# Patient Record
Sex: Female | Born: 1955 | Race: White | Hispanic: No | Marital: Married | Smoking: Never smoker
Health system: Southern US, Community
[De-identification: ages and names within clinical notes are randomized; demographics above are authoritative.]

## PROBLEM LIST (undated history)

## (undated) HISTORY — PX: SHOULDER SURGERY: SHX246

## (undated) HISTORY — PX: HX GALL BLADDER SURGERY/CHOLE: SHX55

## (undated) HISTORY — PX: HX FOOT SURGERY: 2100001154

## (undated) HISTORY — PX: HX HYSTERECTOMY: SHX81

## (undated) HISTORY — PX: SHOULDER ARTHROSCOPY W/ ROTATOR CUFF REPAIR: SHX2400

## (undated) HISTORY — PX: FOOT SURGERY: SHX648

## (undated) HISTORY — PX: TONSILLECTOMY: SUR1361

## (undated) HISTORY — PX: CHOLECYSTECTOMY: SHX55

## (undated) HISTORY — PX: ABDOMINAL HYSTERECTOMY: SHX81

---

## 2006-04-22 ENCOUNTER — Emergency Department: Payer: Self-pay | Admitting: Emergency Medicine

## 2006-12-19 IMAGING — CR RIGHT ANKLE - COMPLETE 3+ VIEW
1 series · 5 of 5 positions shown · non-contrast
Comparison: none

REASON FOR EXAM: Fall
COMMENTS:

PROCEDURE:     DXR - DXR ANKLE RIGHT COMPLETE  - April 22, 2006  [DATE]
RESULT:        Five views of the RIGHT ankle show no fracture, dislocation
or other acute bony abnormality.  Incidental note is made of a small plantar
calcaneal spur.

[Series 1: view not recorded · 0.17mm/px · 5 of 5 slices shown]
[im 1/5]
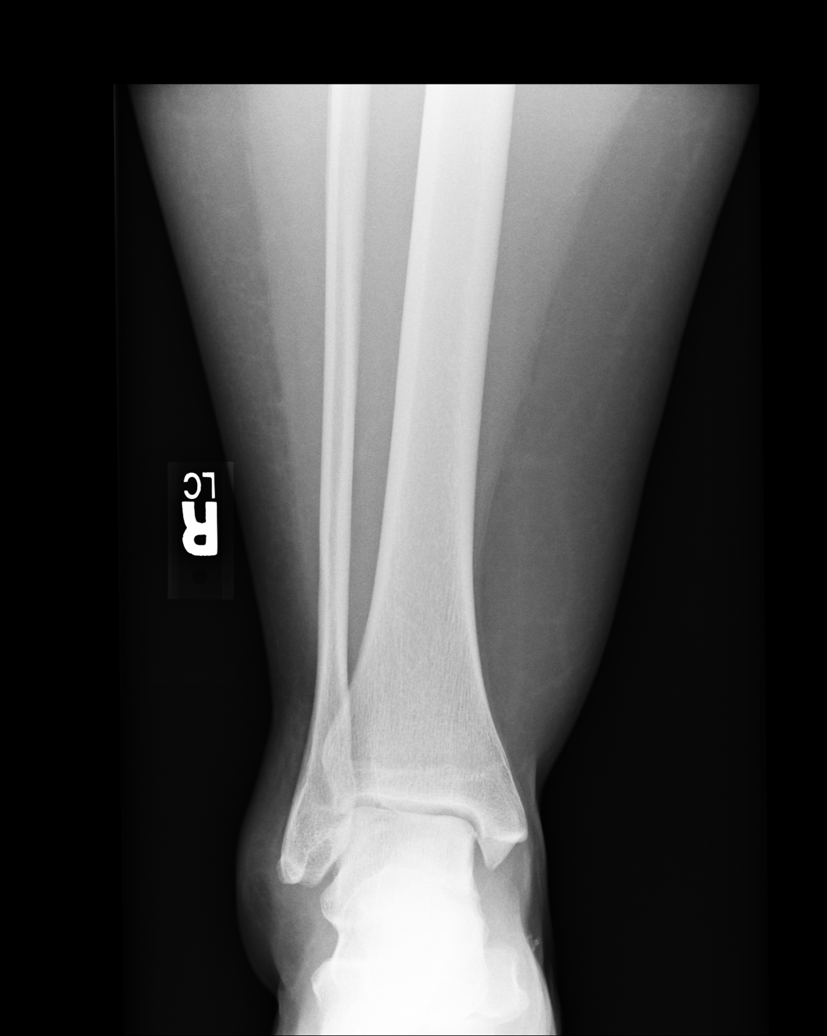
[im 2/5]
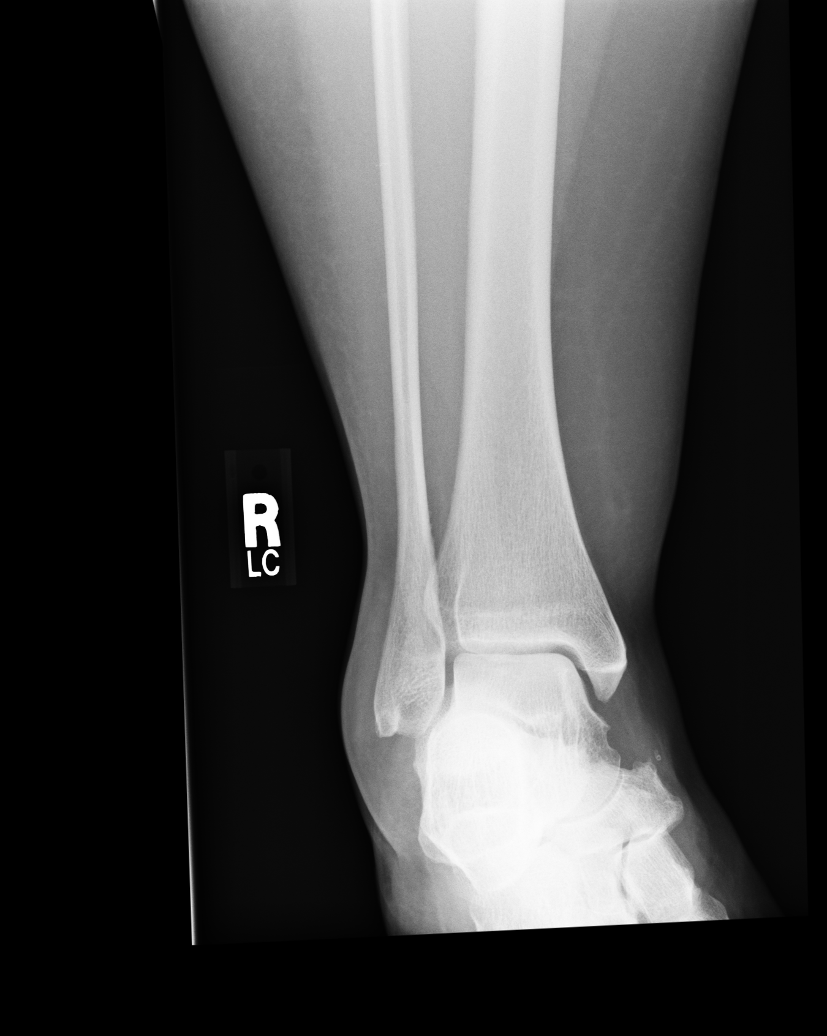
[im 3/5]
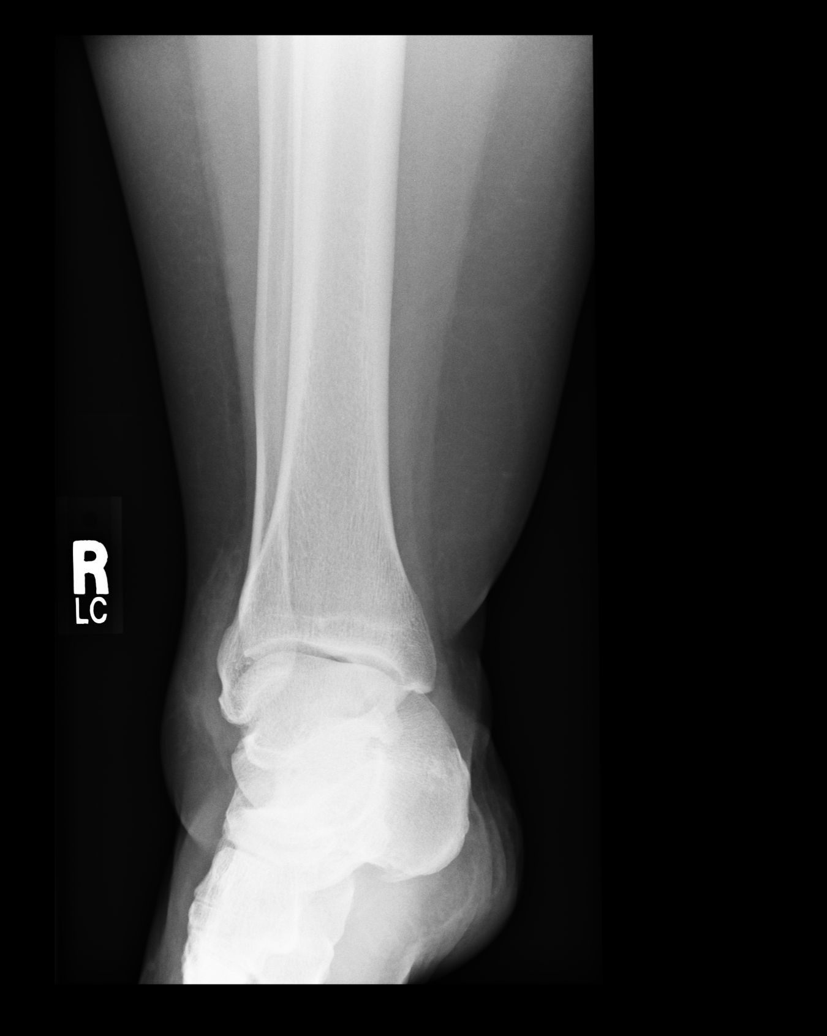
[im 4/5]
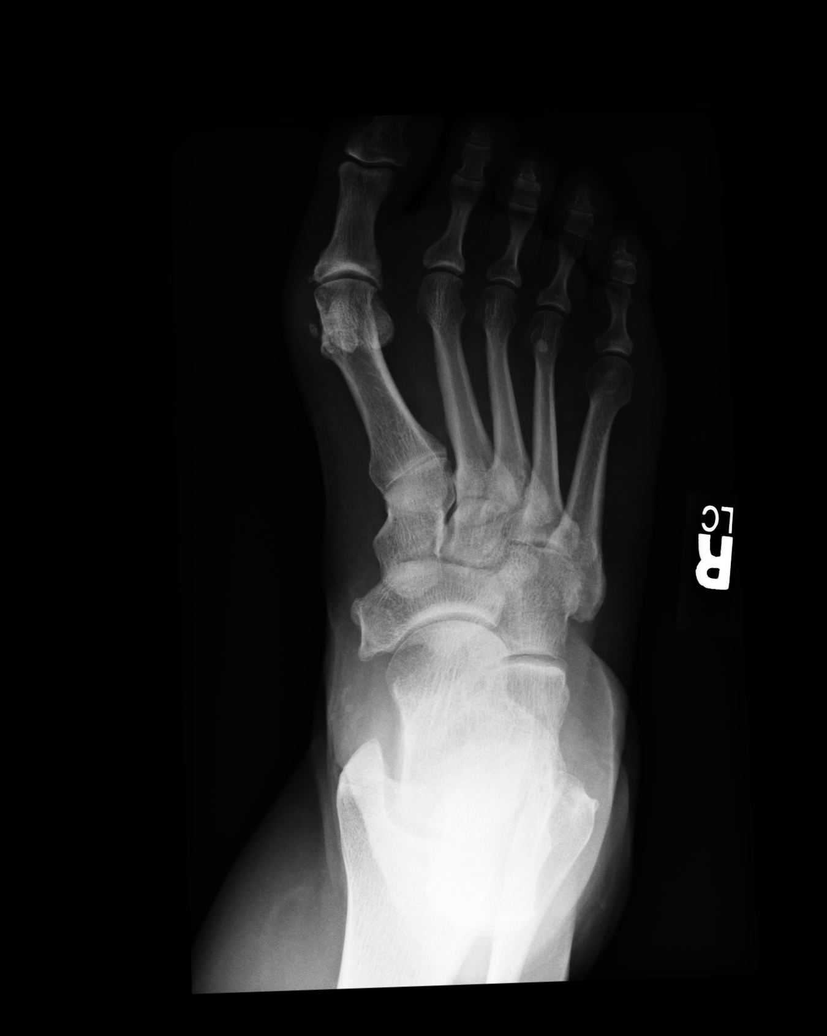
[im 5/5]
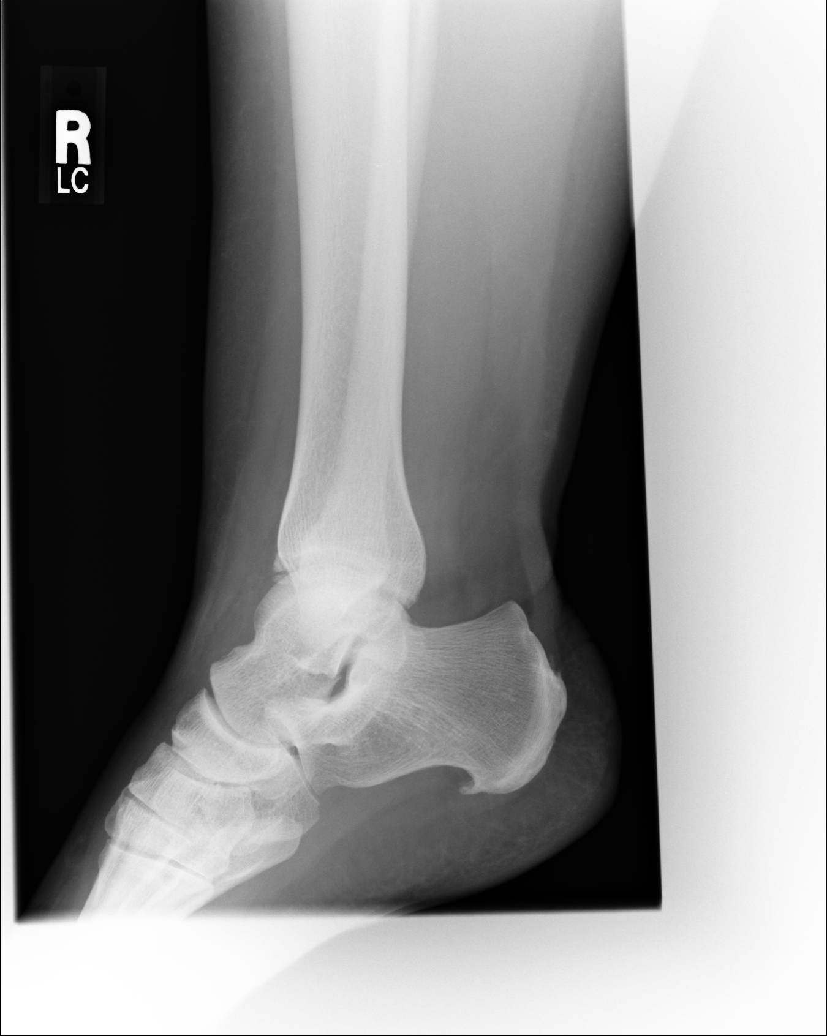

[5 of 5 positions shown; findings below may reference images not displayed]

IMPRESSION: 1.     No acute bony abnormalities are seen.
2.     A plantar calcaneal spur is noted.
3.     There is soft tissue swelling about the lateral malleolus.

## 2018-01-13 ENCOUNTER — Other Ambulatory Visit: Payer: Self-pay

## 2018-01-13 ENCOUNTER — Encounter: Payer: Self-pay | Admitting: Emergency Medicine

## 2018-01-13 ENCOUNTER — Emergency Department: Payer: Self-pay

## 2018-01-13 ENCOUNTER — Emergency Department
Admission: EM | Admit: 2018-01-13 | Discharge: 2018-01-13 | Disposition: A | Discharge status: Home / Self Care | Payer: Self-pay | Attending: Emergency Medicine | Admitting: Emergency Medicine

## 2018-01-13 DIAGNOSIS — R531 Weakness: Secondary | ICD-10-CM

## 2018-01-13 DIAGNOSIS — R1012 Left upper quadrant pain: Secondary | ICD-10-CM | POA: Insufficient documentation

## 2018-01-13 DIAGNOSIS — R109 Unspecified abdominal pain: Secondary | ICD-10-CM

## 2018-01-13 LAB — COMPREHENSIVE METABOLIC PANEL
ALT: 21 U/L (ref 14–54)
AST: 24 U/L (ref 15–41)
Albumin: 4.4 g/dL (ref 3.5–5.0)
Alkaline Phosphatase: 90 U/L (ref 38–126)
Anion gap: 10 (ref 5–15)
BUN: 18 mg/dL (ref 6–20)
CHLORIDE: 102 mmol/L (ref 101–111)
CO2: 29 mmol/L (ref 22–32)
CREATININE: 1.11 mg/dL — AB (ref 0.44–1.00)
Calcium: 9.5 mg/dL (ref 8.9–10.3)
GFR calc non Af Amer: 52 mL/min — ABNORMAL LOW (ref >60)
GFR, EST AFRICAN AMERICAN: 60 mL/min — AB (ref >60)
Glucose, Bld: 103 mg/dL — ABNORMAL HIGH (ref 65–99)
Potassium: 3.9 mmol/L (ref 3.5–5.1)
SODIUM: 141 mmol/L (ref 135–145)
Total Bilirubin: 0.7 mg/dL (ref 0.3–1.2)
Total Protein: 7.9 g/dL (ref 6.5–8.1)

## 2018-01-13 LAB — CBC
HEMATOCRIT: 44.4 % (ref 35.0–47.0)
HEMOGLOBIN: 15.1 g/dL (ref 12.0–16.0)
MCH: 30.4 pg (ref 26.0–34.0)
MCHC: 34 g/dL (ref 32.0–36.0)
MCV: 89.4 fL (ref 80.0–100.0)
Platelets: 246 10*3/uL (ref 150–440)
RBC: 4.96 MIL/uL (ref 3.80–5.20)
RDW: 13.1 % (ref 11.5–14.5)
WBC: 6.6 10*3/uL (ref 3.6–11.0)

## 2018-01-13 LAB — URINALYSIS, COMPLETE (UACMP) WITH MICROSCOPIC
BILIRUBIN URINE: NEGATIVE
Bacteria, UA: NONE SEEN
Glucose, UA: NEGATIVE mg/dL
HGB URINE DIPSTICK: NEGATIVE
KETONES UR: NEGATIVE mg/dL
NITRITE: NEGATIVE
PROTEIN: NEGATIVE mg/dL
RBC / HPF: NONE SEEN RBC/hpf (ref 0–5)
Specific Gravity, Urine: 1.021 (ref 1.005–1.030)
pH: 6 (ref 5.0–8.0)

## 2018-01-13 LAB — TROPONIN I

## 2018-01-13 LAB — LIPASE, BLOOD: LIPASE: 34 U/L (ref 11–51)

## 2018-01-13 MED ORDER — KETOROLAC TROMETHAMINE 60 MG/2ML IM SOLN
INTRAMUSCULAR | Status: AC
  Administered 2018-01-13: 60 mg
  Filled 2018-01-13: qty 2

## 2018-01-13 MED ORDER — KETOROLAC TROMETHAMINE 60 MG/2ML IM SOLN: 60.0000 mg | Freq: Once | INTRAMUSCULAR | Status: DC

## 2018-01-13 NOTE — ED Triage Notes (Signed)
Pt presents to ED with c/o left side pain and feeling sick for an extended period of time (>year)  with generalized fatigue. Pt states the past few days after eating she has noticed pain on her left side and is wondering if she has something going on with her spleen or pancreas. Pt also  reports having frequent diarrhea the past few days and states she thinks she might be dehydrated. Diarrhea has almost completely resolved. Pt states "I've been sick for a year in my organs or something. Its almost like something is poisoning me." pt states "I just want to feel healthy again."

## 2018-01-13 NOTE — ED Provider Notes (Signed)
[facility] Emergency Department Provider Note       Time seen: ----------------------------------------- 9:39 PM on 01/13/2018 -----------------------------------------   I have reviewed the triage vital signs and the nursing notes.  HISTORY   Chief Complaint Flank Pain and Abdominal Pain    HPI Gail Glover is a 62 y.o. female with a history of previous hysterectomy, cholecystectomy and rotator cuff repair who presents to the ED for left flank pain and generalized fatigue for an extended period of time.  Patient states for the past few days after eating she has noticed pain in her left flank and she was wondering something is was wrong with her spleen or pancreas.  She has had some frequent diarrhea and was concerned she may be dehydrated.  History reviewed. No pertinent past medical history.  There are no active problems to display for this patient.   Past Surgical History:  Procedure Laterality Date  . ABDOMINAL HYSTERECTOMY    . CHOLECYSTECTOMY    . FOOT SURGERY    . SHOULDER ARTHROSCOPY W/ ROTATOR CUFF REPAIR    . TONSILLECTOMY      Allergies Patient has no known allergies.  Social History Social History   Tobacco Use  . Smoking status: Never Smoker  . Smokeless tobacco: Never Used  Substance Use Topics  . Alcohol use: No    Frequency: Never  . Drug use: No    Review of Systems Constitutional: Negative for fever. Cardiovascular: Negative for chest pain. Respiratory: Negative for shortness of breath. Gastrointestinal: Positive for abdominal pain Genitourinary: Negative for dysuria. Musculoskeletal: Negative for back pain. Skin: Negative for rash. Neurological: Negative for headaches, focal weakness or numbness.  All systems negative/normal/unremarkable except as stated in the HPI  ____________________________________________   PHYSICAL EXAM:  VITAL SIGNS: ED Triage Vitals  Enc Vitals Group     BP 01/13/18 1956 131/65      Pulse Rate 01/13/18 1956 77     Resp --      Temp 01/13/18 1956 98.8 F (37.1 C)     Temp Source 01/13/18 1956 Oral     SpO2 01/13/18 1956 98 %     Weight 01/13/18 2006 300 lb (136.1 kg)     Height 01/13/18 2006 5\' 6"  (1.676 m)     Head Circumference --      Peak Flow --      Pain Score 01/13/18 2006 7     Pain Loc --      Pain Edu? --      Excl. in GC? --     Constitutional: Alert and oriented. Well appearing and in no distress. Eyes: Conjunctivae are normal. Normal extraocular movements. ENT   Head: Normocephalic and atraumatic.   Nose: No congestion/rhinnorhea.   Mouth/Throat: Mucous membranes are moist.   Neck: No stridor. Cardiovascular: Normal rate, regular rhythm. No murmurs, rubs, or gallops. Respiratory: Normal respiratory effort without tachypnea nor retractions. Breath sounds are clear and equal bilaterally. No wheezes/rales/rhonchi. Gastrointestinal: Mild left flank tenderness, no rebound or guarding.  Normal bowel sounds. Musculoskeletal: Nontender with normal range of motion in extremities. No lower extremity tenderness nor edema. Neurologic:  Normal speech and language. No gross focal neurologic deficits are appreciated.  Skin:  Skin is warm, dry and intact. No rash noted. Psychiatric: Mood and affect are normal. Speech and behavior are normal.  ___________________________________________  ED COURSE:  As part of my medical decision making, I reviewed the following data within the electronic MEDICAL RECORD NUMBER History obtained  from family if available, nursing notes, old chart and ekg, as well as notes from prior ED visits. Patient presented for flank pain, we will assess with labs and imaging as indicated at this time.   Procedures ____________________________________________   LABS (pertinent positives/negatives)  Labs Reviewed  COMPREHENSIVE METABOLIC PANEL - Abnormal; Notable for the following components:      Result Value   Glucose, Bld  103 (*)    Creatinine, Ser 1.11 (*)    GFR calc non Af Amer 52 (*)    GFR calc Af Amer 60 (*)    All other components within normal limits  URINALYSIS, COMPLETE (UACMP) WITH MICROSCOPIC - Abnormal; Notable for the following components:   Color, Urine YELLOW (*)    APPearance CLEAR (*)    Leukocytes, UA SMALL (*)    Squamous Epithelial / LPF 0-5 (*)    All other components within normal limits  LIPASE, BLOOD  CBC  TROPONIN I    RADIOLOGY  Abdomen 2 view IMPRESSION: Normal bowel gas pattern, no free air. ____________________________________________  DIFFERENTIAL DIAGNOSIS   Gas pain, muscle strain, renal colic, UTI, pyelonephritis, GERD  FINAL ASSESSMENT AND PLAN  Abdominal pain   Plan: Patient had presented for abdominal pain of uncertain etiology. Patient's labs are normal. Patient's imaging is also normal.  Pain is likely musculoskeletal or benign in origin.  She is stable for outpatient follow-up.   Ulice Dash, MD   Note: This note was generated in part or whole with voice recognition software. Voice recognition is usually quite accurate but there are transcription errors that can and very often do occur. I apologize for any typographical errors that were not detected and corrected.     Emily Filbert, MD 01/13/18 2241

## 2018-01-13 NOTE — ED Notes (Signed)
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

## 2022-02-06 ENCOUNTER — Encounter (INDEPENDENT_AMBULATORY_CARE_PROVIDER_SITE_OTHER): Payer: Self-pay | Admitting: Family Medicine

## 2022-02-06 ENCOUNTER — Other Ambulatory Visit: Payer: Self-pay

## 2022-02-06 ENCOUNTER — Ambulatory Visit: Payer: Medicare PPO | Attending: Family Medicine | Admitting: Family Medicine

## 2022-02-06 VITALS — BP 124/74 | HR 101 | Temp 97.7°F | Resp 15 | Ht 69.0 in | Wt 319.7 lb

## 2022-02-06 DIAGNOSIS — Z7689 Persons encountering health services in other specified circumstances: Secondary | ICD-10-CM | POA: Insufficient documentation

## 2022-02-06 DIAGNOSIS — Z6841 Body Mass Index (BMI) 40.0 and over, adult: Secondary | ICD-10-CM | POA: Insufficient documentation

## 2022-02-06 DIAGNOSIS — Z1231 Encounter for screening mammogram for malignant neoplasm of breast: Secondary | ICD-10-CM | POA: Insufficient documentation

## 2022-02-06 DIAGNOSIS — G4730 Sleep apnea, unspecified: Secondary | ICD-10-CM | POA: Insufficient documentation

## 2022-02-06 DIAGNOSIS — Z0000 Encounter for general adult medical examination without abnormal findings: Secondary | ICD-10-CM | POA: Insufficient documentation

## 2022-02-06 DIAGNOSIS — I890 Lymphedema, not elsewhere classified: Secondary | ICD-10-CM | POA: Insufficient documentation

## 2022-02-06 DIAGNOSIS — Z1211 Encounter for screening for malignant neoplasm of colon: Secondary | ICD-10-CM | POA: Insufficient documentation

## 2022-02-06 DIAGNOSIS — R600 Localized edema: Secondary | ICD-10-CM | POA: Insufficient documentation

## 2022-02-06 NOTE — H&P (Signed)
[facility]                                                         [address], [place]   [place] [id number]    Name: Lori Stevens  Age: 66 y.o.  MRN: [id number]  Date:02/06/2022    Chief Complaint:   Left Leg Pain (Patient states she thinks she has fluid in her leg.  Patient states that she sometimes has bruising around her knee cap on this leg.  Patient states that her left leg has been hurting for months.  Patient states that her pain is 5/10 in her left leg.)    HPI:  Lori Stevens is a pleasant 66 y.o. patient here to establish care per the above.  The following conditions, either ongoing or resolved are listed in the chart.  Patient Active Problem List   Diagnosis   . Morbid obesity with BMI of 45.0-49.9, adult (CMS HCC)   . Lymphedema   . Leg edema      The patient takes the following medications listed in the chart or/and from the patient's history  No current outpatient medications on file.      Management of the above conditions:  -The patient takes the medications as prescribed. Stable overall.  -Complaints/issues/quality of life topics:  -Lymphedema limits daily activity and ambulation, chronic, gradually worsening  -Hx of sleep apnea, [facility]  Preventative healthcare  Last colonoscopy: Due  Last mammogram: Due  Reports no chest pain, SOB, syncope, mood/behavorial abnormalities.   Review of Systems: Other than listed in the HPI negative for other systems.  Past Medical History was reviewed and is negative for any except per above  Past Surgical History:   Procedure Laterality Date   . HX CHOLECYSTECTOMY     . HX FOOT SURGERY     . HX HYSTERECTOMY     . SHOULDER SURGERY       No current outpatient medications on file.     Allergies   Allergen Reactions   . Cipro [Ciprofloxacin Hcl]    . Codeine      Family Medical History:    None       Social History     Tobacco Use   . Smoking status: Never   . Smokeless tobacco: Never   Substance Use  Topics   . Alcohol use: Never     Objective :  Vitals:    02/06/22 0949   BP: 124/74   Pulse: (!) 101   Resp: 15   Temp: 36.5 C (97.7 F)   TempSrc: Oral   SpO2: 98%   Weight: (!) 145 kg (319 lb 10.7 oz)   Height: 1.753 m (5\' 9" )   BMI: 47.31     Body mass index is 47.21 kg/m.  There are no exam notes on file for this visit.  Physical Exam  Vitals and nursing note reviewed.   Cardiovascular:      Rate and Rhythm: Normal rate and regular rhythm.      Pulses: Normal pulses.      Heart sounds: Normal heart sounds.   Pulmonary:      Effort: Pulmonary effort is normal.      Breath sounds: Normal breath sounds.   Psychiatric:  Mood and Affect: Mood normal.         Behavior: Behavior normal.         Thought Content: Thought content normal.         Judgment: Judgment normal.   Other     Lower edema, b/l non pitting     Gait slower due to the above    PMH, Surgical H/o , FH, Social H/o, Medications, allergies reviewed and updated as appropriate   Assessment/Plan  Problem List Items Addressed This Visit        Musculoskeletal    Leg edema    Relevant Orders    CBC    COMPREHENSIVE METABOLIC PNL, FASTING    HGA1C (HEMOGLOBIN A1C WITH EST AVG GLUCOSE)    THYROID STIMULATING HORMONE (SENSITIVE TSH)    MICROALBUMIN/CREATININE RATIO, URINE, RANDOM    Refer to External Provider       Other    Morbid obesity with BMI of 45.0-49.9, adult (CMS HCC)    Lymphedema    Relevant Orders    Refer to External Provider   Other Visit Diagnoses     Encounter to establish care    -  Primary    Preventative health care        Relevant Orders    COMPREHENSIVE METABOLIC PNL, FASTING    HGA1C (HEMOGLOBIN A1C WITH EST AVG GLUCOSE)    LIPID PANEL    Sleep apnea, unspecified type        Screening for colon cancer        Relevant Orders    FECAL DNA TESTING (AMB)    Encounter for screening mammogram for malignant neoplasm of breast        Relevant Orders    MAMMO BILATERAL SCREENING-ADDL VIEWS/BREAST US AS REQ BY RAD          Diagnostics/therapeutics/referrals per the above diagnosis/diagnoses  Follow up on the results.  Follow up in person or thorough phone/electronic/mail per the clinical need/complexity  Patient to let us know if any concerns/symptoms/  If all is well and no complaints patient can be seen again per the below listed follow up schedule.  It is advised patient complete tests/preventative care recommendations before next visit as needed and recommended      Current Discharge Medication List          Accurate as of February 06, 2022 12:14 PM. If you have any questions, ask your nurse or doctor.            STOP taking these medications.    furosemide 40 mg Tablet  Commonly known as: LASIX  Stopped by: Dorothyann Peng, MD     POTASSIUM CHLORIDE ORAL  Stopped by: Dorothyann Peng, MD            I have reviewed Vitals and chart in its entirety and concur with clinical staff on their assessments. BOP reports reviewed as required and discussed with patient.  All  Recent labs/procedures/ recent ED or quick care visits reviewed if  received by our office . Requested if Not received   Patient / family has been  advised/counseled about medications prescribed and their benefits and side effects as per latest drug PI available. Also have counseled about compliance with advice and medications and lab follow up. I have answered all questions to patient's/family satisfaction regarding his medical management.   The patient has been  informed to contact the office in 7 business days if a message/lab results/referral/imaging results have not been  conveyed to the patient.   Follow-up as needed/documented in chart    Return in about 4 weeks (around 03/06/2022).    Kathie Posa Jesusita Oka, MD

## 2022-02-20 ENCOUNTER — Ambulatory Visit (HOSPITAL_BASED_OUTPATIENT_CLINIC_OR_DEPARTMENT_OTHER): Payer: Medicare PPO

## 2022-03-04 ENCOUNTER — Emergency Department (HOSPITAL_COMMUNITY): Payer: Medicare PPO

## 2022-03-04 ENCOUNTER — Emergency Department
Admission: EM | Admit: 2022-03-04 | Discharge: 2022-03-04 | Disposition: A | Discharge status: Home / Self Care | Payer: Medicare PPO | Attending: Emergency Medicine | Admitting: Emergency Medicine

## 2022-03-04 ENCOUNTER — Encounter (HOSPITAL_COMMUNITY): Payer: Self-pay

## 2022-03-04 ENCOUNTER — Other Ambulatory Visit: Payer: Self-pay

## 2022-03-04 DIAGNOSIS — M85862 Other specified disorders of bone density and structure, left lower leg: Secondary | ICD-10-CM | POA: Insufficient documentation

## 2022-03-04 DIAGNOSIS — M1990 Unspecified osteoarthritis, unspecified site: Secondary | ICD-10-CM

## 2022-03-04 DIAGNOSIS — M1712 Unilateral primary osteoarthritis, left knee: Secondary | ICD-10-CM | POA: Insufficient documentation

## 2022-03-04 DIAGNOSIS — G8929 Other chronic pain: Secondary | ICD-10-CM | POA: Insufficient documentation

## 2022-03-04 DIAGNOSIS — M2342 Loose body in knee, left knee: Secondary | ICD-10-CM | POA: Insufficient documentation

## 2022-03-04 MED ORDER — NAPROXEN 500 MG TABLET
500.0000 mg | ORAL_TABLET | Freq: Two times a day (BID) | ORAL | 0 refills | Status: AC | PRN
Start: 2022-03-04 — End: 2022-03-11

## 2022-03-04 MED ORDER — NAPROXEN 250 MG TABLET
500.0000 mg | ORAL_TABLET | ORAL | Status: AC
Start: 2022-03-04 — End: 2022-03-04
  Administered 2022-03-04: 500 mg via ORAL
  Filled 2022-03-04: qty 2

## 2022-03-04 MED ORDER — METHYLPREDNISOLONE 4 MG TABLETS IN A DOSE PACK
ORAL_TABLET | ORAL | 0 refills | Status: AC
Start: 2022-03-04 — End: ?

## 2022-03-04 MED ORDER — PREDNISONE 20 MG TABLET
40.0000 mg | ORAL_TABLET | ORAL | Status: AC
Start: 2022-03-04 — End: 2022-03-04
  Administered 2022-03-04: 40 mg via ORAL
  Filled 2022-03-04: qty 2

## 2022-03-04 NOTE — ED Triage Notes (Signed)
Left knee pain for months

## 2022-03-04 NOTE — ED Nurses Note (Signed)
Patient is AAOx4. Assessment performed at this time, refer to flowsheet. Patient reports she has left leg pain that is worse at night reports she has been staying off of it in hopes it would heel on ts own. All safety measures in place, bed in lowest position, wheels locked call bell in reach.  -Lorri Frederick, RN

## 2022-03-04 NOTE — ED Nurses Note (Signed)
Patient given discharge paperwork and received  paper prescriptions, refer to AVS. Educated on discharge instructions, follow up and medications. Patient verbalized understanding. No distress noted. -Lorri Frederick, RN

## 2022-03-04 NOTE — Discharge Instructions (Signed)
Your case was discussed with Orthopedic surgery, Dr. Alycia Rossetti has agreed to see you in his clinic next week for further management of your severe knee osteoarthritis.    Continue use of Ace wrap when ambulating.    Ice, rest, elevate your knee frequently.    Continue steroids, Naprosyn, and Pepcid as prescribed.

## 2022-03-04 NOTE — ED Provider Notes (Signed)
Emergency Department Provider Note  HPI - 03/04/2022    COVID-19 PANDEMIC IN EFFECT    History of Present Illness      Lori Stevens 66 y.o. female  Date of Birth: 02/22/1956     Attending: Dr. Rushie Goltz    PCP: Med Jesusita Oka, MD  Scribe: Coralee North        Chief Complaint   Patient presents with   . Leg Pain       Arrival: The patient arrived by [employer] and is unaccompanied.   History Provided by: Patient     History limitations: None      HPI:  Lori Stevens is a 66 y.o. female who presents to the ED today for L sided knee pain. Patient states that she has had persistent L sided knee pain for "months." She reports having been to the doctor for this issue but states that "they wouldn't do anything" for her complaint. She notes that she was seen at Urgent Care one month ago and was prescribed steroids at that time. She remarks that the steroids helped "for five days" but the pain and discomfort returned as soon as her steroid course was finished. She presently complains of pain in the L knee and states that it "feels hot and like it's burning or something." Patient denies chest pain, and there are no other complaints at this time.    Please see nursing notes for complete PMHx, PSHx, FHx, SHx, allergies and medication regimen.     Review of Systems     Review of Systems:    Constitutional: No fever. No chills. No malaise/fatigue.   Skin: No rashes. No itching.   HENT: No head injury. No sore throat. No ear pain. No difficulty swallowing.  Eyes: No vision changes. No eye redness. No eye discharge.   Cardiovascular: No chest pain. No palpitations. No leg swelling.   Respiratory: No cough. No SOB.   GI/abdomen: No abdominal pain. No nausea. No vomiting. No diarrhea. No constipation.   GU: No dysuria. No hematuria. No decreased urine output.   MSK/Extremities: +L knee pain. No neck pain. No back pain. No extremity pain.   Neuro: No headache. No dizziness. No numbness/tingling. No speech change. No LOC.     Psych: No SI. No HI. No substance abuse.   All other systems reviewed and are negative, unless commented on in the HPI.      Past Medical History     Filed Vitals:    03/04/22 1521 03/04/22 1615 03/04/22 1630   BP: 122/74 105/71 (!) 142/80   Pulse: 88     Resp: 18     Temp: 36.5 C (97.7 F)  36.5 C (97.7 F)   SpO2: 96%  100%       PMHx:    Medical History          No past medical history on file.         Allergies:    Allergies   Allergen Reactions   . Cipro [Ciprofloxacin Hcl]    . Codeine        Social History  Social History     Tobacco Use   . Smoking status: Never   . Smokeless tobacco: Never   Vaping Use   . Vaping Use: Never used   Substance Use Topics   . Alcohol use: Never   . Drug use: Never       Family History  Family Medical History:    None  Home Meds:   No current facility-administered medications for this encounter.    Current Outpatient Medications:   Marland Kitchen.  Methylprednisolone (MEDROL DOSEPACK) 4 mg Oral Tablets, Dose Pack, Take as instructed., Disp: 21 Tablet, Rfl: 0  .  naproxen (NAPROSYN) 500 mg Oral Tablet, Take 1 Tablet (500 mg total) by mouth Twice per day as needed for Pain for up to 7 days, Disp: 14 Tablet, Rfl: 0          Exam and Objective Findings     Physical Exam  Nursing note and vitals reviewed.  Vital signs reviewed as above.     Constitutional:       General: Patient awake, alert and is not in acute distress.     Appearance: Patient is obese.  HENT:      Head: Normocephalic and atraumatic.      Nose: Nose normal. No rhinorrhea.      Mouth: Mucous membranes are moist.      Ears: TM's appear normal with no erythema or bulging.  Eyes:      Extraocular Movements: Extraocular movements intact.      Conjunctiva/sclera: Conjunctivae normal.      Pupils: Pupils are equal, round, and reactive to light.   Neck:      Thyroid: No thyromegaly.      Vascular: No JVD.      Trachea: No tracheal deviation.   Cardiovascular:      Rate and Rhythm: Normal rate and regular rhythm.      Pulses:  Normal pulses. Distal pulses intact bilaterally.     Heart sounds: Normal heart sounds. No murmur heard.      Edema: No leg edema.  Pulmonary:      Effort: Pulmonary effort is normal. No respiratory distress.      Breath sounds: Normal breath sounds. No stridor. No wheezing.   Abdominal:      General: Negative Murphy's, Mcburney's, Rovsing's, obturator or heel tap signs.           Abdomen is nondistended. Bowel sounds are normal.       Palpations: Abdomen is soft.      Tenderness: There is no abdominal tenderness. There is no guarding or rebound.   Musculoskeletal:         General: Tenderness to palpation of the lateral aspect of the L knee with small effusion noted. No erythema or warmth. Intact ROM and sensation. No calf tenderness.      Cervical: Normal range of motion and neck supple. No tenderness.   Skin:     General: Skin is warm and dry.      Capillary Refill: Capillary refill takes less than 2 seconds.      Findings: No rash. No lesions or abscesses.  Neurological:      General: No focal deficit present.      Mental Status: Patient is alert and oriented to person, place, and time with normal speech. Memory is normal and thought process is intact.      Cranial Nerves: No cranial nerve deficit. Cranial nerves 2-12 intact.   Psychiatric:         Mood and Affect: Mood and affect normal.    Orders     Work-up:  Orders Placed This Encounter   . XR KNEE LEFT 4 OR MORE VIEWS   . naproxen (NAPROSYN) tablet   . predniSONE (DELTASONE) tablet   . naproxen (NAPROSYN) 500 mg Oral Tablet   . Methylprednisolone (MEDROL DOSEPACK) 4 mg Oral Tablets,  Dose Pack        Labs     Labs:  No results found for this or any previous visit (from the past 24 hour(s)).    Abnormal Lab results:  Labs Ordered/Reviewed - No data to display    Imaging     Imaging:   Results for orders placed or performed during the hospital encounter of 03/04/22 (from the past 72 hour(s))   XR KNEE LEFT 4 OR MORE VIEWS     Status: None    Narrative    Left  knee:    CLINICAL HISTORY: Left knee pain with no known injury.    4 views of the left knee were obtained. The findings are listed below.      Impression    1. Osteopenia but no acute fracture or dislocation.  2. Rather severe tricompartmental osteoarthritis with narrowing of all joint compartments most pronounced medially where there is sclerosis and spurring. There is slight lateral translation of the tibia relative to the distal femur.  3. There is a probable small joint effusion with an associated large 2 cm calcified intra-articular loose body in the suprapatellar joint space anteriorly.      Radiologist location ID: [id number]           Assessment & Plan     Plan: Appropriate testing. Medical Records reviewed.    MDM:   Medical Decision Making  Please see HPI and ED course for additional details.       Amount and/or Complexity of Data Reviewed  Radiology: ordered.  Discussion of management or test interpretation with external provider(s): 1636: Dr. Burna Sis speaks with Dr. Alycia Rossetti, orthopedic, regarding patient's relevant imaging. Dr. Alycia Rossetti agrees to see patient in his office next week for follow-up care.     Risk  Prescription drug management.                   Consults     Consults:    1636: Dr. Burna Sis speaks with Dr. Alycia Rossetti, orthopedic, regarding patient's relevant imaging. Dr. Alycia Rossetti agrees to see patient in his office next week for follow-up care.   Disposition     Impression:   Diagnosis     Diagnosis Comment Added By Time Added    Osteoarthritis, unspecified osteoarthritis type, unspecified site  Rushie Goltz, DO 03/04/2022  4:38 PM        Disposition:  Discharged     Medication instructions were discussed with the patient.   Discussed results, diagnosis, and treatment with the patient.   It was advised that the patient return to the ED with any new, concerning, or worsening symptoms, and follow up as directed.    The patient verbalized understanding of all instructions and had no further questions or  concerns.     Follow up:   Gray Bernhardt, MD  [address]  [place] [address]  [phone]    Call in 1 day        Prescriptions     Prescriptions:   Current Discharge Medication List      START taking these medications    Details   Methylprednisolone (MEDROL DOSEPACK) 4 mg Oral Tablets, Dose Pack Take as instructed.  Qty: 21 Tablet, Refills: 0      naproxen (NAPROSYN) 500 mg Oral Tablet Take 1 Tablet (500 mg total) by mouth Twice per day as needed for Pain for up to 7 days  Qty: 14 Tablet, Refills: 0  Attestations     I am scribing for, and in the presence of, Dr. Rushie Goltz, for services provided on 03/04/2022.  [address], SCRIBE    [place], [place] 03/04/2022, 16:29      {Please don't forget .tgscribe AND .sign; Refresh for ED course dictation--Thank you!}      This note may have been partially generated using MModal Fluency Direct system, and there may be some incorrect words, spellings, and punctuation that were not noted in checking the chart before saving.

## 2022-03-26 ENCOUNTER — Other Ambulatory Visit: Payer: Self-pay

## 2022-03-28 ENCOUNTER — Ambulatory Visit (INDEPENDENT_AMBULATORY_CARE_PROVIDER_SITE_OTHER): Payer: Self-pay | Admitting: Family Medicine

## 2022-06-28 ENCOUNTER — Telehealth (INDEPENDENT_AMBULATORY_CARE_PROVIDER_SITE_OTHER): Payer: Self-pay | Admitting: Family Medicine

## 2022-06-28 NOTE — Telephone Encounter (Signed)
LVM for patient to return call, pt due for colon cancer screening.

## 2022-12-27 ENCOUNTER — Telehealth (INDEPENDENT_AMBULATORY_CARE_PROVIDER_SITE_OTHER): Payer: Self-pay | Admitting: Family Medicine

## 2022-12-27 NOTE — Telephone Encounter (Signed)
LM for patient to schedule medicare wellness exam.

## 2023-01-22 ENCOUNTER — Encounter (INDEPENDENT_AMBULATORY_CARE_PROVIDER_SITE_OTHER): Payer: Self-pay | Admitting: Internal Medicine

## 2023-01-22 ENCOUNTER — Encounter (INDEPENDENT_AMBULATORY_CARE_PROVIDER_SITE_OTHER): Payer: Self-pay | Admitting: Family Medicine

## 2023-03-18 ENCOUNTER — Telehealth (INDEPENDENT_AMBULATORY_CARE_PROVIDER_SITE_OTHER): Payer: Self-pay | Admitting: Family Medicine

## 2023-03-18 NOTE — Telephone Encounter (Signed)
Called patient LM to schedule medicare wellness.

## 2023-03-25 ENCOUNTER — Telehealth (INDEPENDENT_AMBULATORY_CARE_PROVIDER_SITE_OTHER): Payer: Self-pay | Admitting: Family Medicine

## 2023-03-25 NOTE — Telephone Encounter (Signed)
Called patient regarding colon cancer screening that is due.Left voicemail.

## 2023-08-01 ENCOUNTER — Telehealth (INDEPENDENT_AMBULATORY_CARE_PROVIDER_SITE_OTHER): Payer: Self-pay | Admitting: Family Medicine

## 2023-08-01 NOTE — Telephone Encounter (Signed)
Left voicemail for patient to call the office to schedule AWV.    Gregor Hams, RN

## 2023-11-19 ENCOUNTER — Telehealth (INDEPENDENT_AMBULATORY_CARE_PROVIDER_SITE_OTHER): Payer: Self-pay | Admitting: Family Medicine

## 2023-11-19 NOTE — Telephone Encounter (Signed)
LM for patient to confirm she is still a patient of Dr. Clayborne Artist.

## 2023-11-30 ENCOUNTER — Other Ambulatory Visit (INDEPENDENT_AMBULATORY_CARE_PROVIDER_SITE_OTHER): Payer: Self-pay | Admitting: Family Medicine
